# Patient Record
Sex: Female | Born: 1995 | Race: White | Hispanic: No | Marital: Single | Smoking: Current every day smoker
Health system: Southern US, Community
[De-identification: ages and names within clinical notes are randomized; demographics above are authoritative.]

## PROBLEM LIST (undated history)

## (undated) DIAGNOSIS — K219 Gastro-esophageal reflux disease without esophagitis: Secondary | ICD-10-CM

## (undated) DIAGNOSIS — N390 Urinary tract infection, site not specified: Secondary | ICD-10-CM

## (undated) HISTORY — PX: NO PAST SURGERIES: SHX2092

---
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
## 2014-09-13 ENCOUNTER — Inpatient Hospital Stay (HOSPITAL_COMMUNITY)
Admission: AD | Admit: 2014-09-13 | Discharge: 2014-09-13 | Disposition: A | Discharge status: Home / Self Care | Payer: BLUE CROSS/BLUE SHIELD | Source: Ambulatory Visit | Attending: Obstetrics & Gynecology | Admitting: Obstetrics & Gynecology

## 2014-09-13 ENCOUNTER — Encounter (HOSPITAL_COMMUNITY): Payer: Self-pay | Admitting: *Deleted

## 2014-09-13 DIAGNOSIS — F17210 Nicotine dependence, cigarettes, uncomplicated: Secondary | ICD-10-CM | POA: Diagnosis not present

## 2014-09-13 DIAGNOSIS — A499 Bacterial infection, unspecified: Secondary | ICD-10-CM

## 2014-09-13 DIAGNOSIS — N926 Irregular menstruation, unspecified: Secondary | ICD-10-CM | POA: Insufficient documentation

## 2014-09-13 DIAGNOSIS — N760 Acute vaginitis: Secondary | ICD-10-CM | POA: Diagnosis not present

## 2014-09-13 DIAGNOSIS — Z3202 Encounter for pregnancy test, result negative: Secondary | ICD-10-CM | POA: Diagnosis not present

## 2014-09-13 DIAGNOSIS — B9689 Other specified bacterial agents as the cause of diseases classified elsewhere: Secondary | ICD-10-CM | POA: Insufficient documentation

## 2014-09-13 DIAGNOSIS — K219 Gastro-esophageal reflux disease without esophagitis: Secondary | ICD-10-CM | POA: Diagnosis not present

## 2014-09-13 HISTORY — DX: Urinary tract infection, site not specified: N39.0

## 2014-09-13 HISTORY — DX: Gastro-esophageal reflux disease without esophagitis: K21.9

## 2014-09-13 LAB — URINALYSIS, ROUTINE W REFLEX MICROSCOPIC
Bilirubin Urine: NEGATIVE
Glucose, UA: NEGATIVE mg/dL
Hgb urine dipstick: NEGATIVE
KETONES UR: NEGATIVE mg/dL
LEUKOCYTES UA: NEGATIVE
NITRITE: NEGATIVE
Protein, ur: NEGATIVE mg/dL
SPECIFIC GRAVITY, URINE: 1.015 (ref 1.005–1.030)
UROBILINOGEN UA: 0.2 mg/dL (ref 0.0–1.0)
pH: 6.5 (ref 5.0–8.0)

## 2014-09-13 LAB — WET PREP, GENITAL
Trich, Wet Prep: NONE SEEN
YEAST WET PREP: NONE SEEN

## 2014-09-13 LAB — POCT PREGNANCY, URINE: PREG TEST UR: NEGATIVE

## 2014-09-13 MED ORDER — METRONIDAZOLE 500 MG PO TABS: 500.0000 mg | ORAL_TABLET | Freq: Two times a day (BID) | ORAL | Status: AC

## 2014-09-13 NOTE — Discharge Instructions (Signed)
Bacterial Vaginosis Bacterial vaginosis is a vaginal infection that occurs when the normal balance of bacteria in the vagina is disrupted. It results from an overgrowth of certain bacteria. This is the most common vaginal infection in women of childbearing age. Treatment is important to prevent complications, especially in pregnant women, as it can cause a premature delivery. CAUSES  Bacterial vaginosis is caused by an increase in harmful bacteria that are normally present in smaller amounts in the vagina. Several different kinds of bacteria can cause bacterial vaginosis. However, the reason that the condition develops is not fully understood. RISK FACTORS Certain activities or behaviors can put you at an increased risk of developing bacterial vaginosis, including:  Having a new sex partner or multiple sex partners.  Douching.  Using an intrauterine device (IUD) for contraception. Women do not get bacterial vaginosis from toilet seats, bedding, swimming pools, or contact with objects around them. SIGNS AND SYMPTOMS  Some women with bacterial vaginosis have no signs or symptoms. Common symptoms include:  Grey vaginal discharge.  A fishlike odor with discharge, especially after sexual intercourse.  Itching or burning of the vagina and vulva.  Burning or pain with urination. DIAGNOSIS  Your health care provider will take a medical history and examine the vagina for signs of bacterial vaginosis. A sample of vaginal fluid may be taken. Your health care provider will look at this sample under a microscope to check for bacteria and abnormal cells. A vaginal pH test may also be done.  TREATMENT  Bacterial vaginosis may be treated with antibiotic medicines. These may be given in the form of a pill or a vaginal cream. A second round of antibiotics may be prescribed if the condition comes back after treatment.  HOME CARE INSTRUCTIONS   Only take over-the-counter or prescription medicines as  directed by your health care provider.  If antibiotic medicine was prescribed, take it as directed. Make sure you finish it even if you start to feel better.  Do not have sex until treatment is completed.  Tell all sexual partners that you have a vaginal infection. They should see their health care provider and be treated if they have problems, such as a mild rash or itching.  Practice safe sex by using condoms and only having one sex partner. SEEK MEDICAL CARE IF:   Your symptoms are not improving after 3 days of treatment.  You have increased discharge or pain.  You have a fever. MAKE SURE YOU:   Understand these instructions.  Will watch your condition.  Will get help right away if you are not doing well or get worse. FOR MORE INFORMATION  Centers for Disease Control and Prevention, Division of STD Prevention: www.cdc.gov/std American Sexual Health Association (ASHA): www.ashastd.org  Document Released: 05/29/2005 Document Revised: 03/19/2013 Document Reviewed: 01/08/2013 Exit[employer] Patient Information 2015 [employer]. This information is not intended to replace advice given to you by your health care provider. Make sure you discuss any questions you have with your health care provider.  

## 2014-09-13 NOTE — MAU Note (Signed)
Pt c/o  nausea and vomiting for the past 3 weeks that increased yesterday and low abd cramping began yesterday as well.  7 weeks late for period , 4 negative UPT's. Reports pink spotting 3 weeks ago.

## 2014-09-13 NOTE — MAU Provider Note (Signed)
History     CSN: [id number]  Arrival date and time: 09/13/14 1633   First Provider Initiated Contact with Patient 09/13/14 1750      No chief complaint on file. irregular menses HPI Tiffany Combs 19 y.o. G0P0000 presents with absence of menses since 07/20/14 in the setting of typically regular menses.  She has noticed increased nausea, vomiting, chills, cramping and lower back pain.  She denies abdominal pain, dysuria, constipation, diarrhea, fever, weakness.   OB History    Gravida Para Term Preterm AB TAB SAB Ectopic Multiple Living        Past Medical History  Diagnosis Date  . Migraine   . UTI (lower urinary tract infection)     many in high school but non for 4-5 years  . GERD (gastroesophageal reflux disease)     Past Surgical History  Procedure Laterality Date  . No past surgeries      History reviewed. No pertinent family history.  History  Substance Use Topics  . Smoking status: Current Every Day Smoker    Types: Cigarettes  . Smokeless tobacco: Never Used  . Alcohol Use: No    Allergies: No Known Allergies  Prescriptions prior to admission  Medication Sig Dispense Refill Last Dose  . fluticasone (FLONASE) 50 MCG/ACT nasal spray Place 1 spray into both nostrils daily as needed for allergies or rhinitis.   Past Week at Unknown time  . loratadine (CLARITIN) 10 MG tablet Take 10 mg by mouth daily as needed for allergies.   Past Week at Unknown time  . ALPRAZolam (XANAX) 0.25 MG tablet Take 1-2 tablets (0.25-0.5 mg total) by mouth 3 (three) times daily as needed for anxiety. (Patient not taking: Reported on 09/13/2014) 30 tablet 0 10/03/2012 at Unknown  . silver sulfADIAZINE (SILVADENE) 1 % cream Apply topically daily. (Patient not taking: Reported on 09/13/2014) 50 g 0   . traMADol (ULTRAM) 50 MG tablet Take 1 tablet (50 mg total) by mouth every 6 (six) hours as needed for pain. (Patient not taking: Reported on 09/13/2014) 15 tablet 0     ROS  Pertinent ROS in HPI  Physical Exam   Last menstrual period 07/20/2014.  Physical Exam  Constitutional: She is oriented to person, place, and time. She appears well-developed and well-nourished. No distress.  HENT:  Head: Normocephalic and atraumatic.  Eyes: EOM are normal.  Neck: Normal range of motion.  Cardiovascular: Normal rate, regular rhythm and normal heart sounds.   Respiratory: Effort normal and breath sounds normal. No respiratory distress.  GI: Soft. Bowel sounds are normal. She exhibits no distension and no mass. There is no tenderness. There is no rebound and no guarding.  Genitourinary:  Small to mod amt of white homogenous, malodorous discharge.   Cervix is pink, smooth.  No CMT.  No adnexal mass or tenderness  Musculoskeletal: Normal range of motion.  Neurological: She is alert and oriented to person, place, and time.  Skin: Skin is warm and dry.  Psychiatric: She has a normal mood and affect.    Results for orders placed or performed during the hospital encounter of 09/13/14 (from the past 24 hour(s))  Urinalysis, Routine w reflex microscopic     Status: None   Collection Time: 09/13/14  4:45 PM  Result Value Ref Range   Color, Urine YELLOW YELLOW   APPearance CLEAR CLEAR   Specific Gravity, Urine 1.015 1.005 - 1.030   pH 6.5  5.0 - 8.0   Glucose, UA NEGATIVE NEGATIVE mg/dL   Hgb urine dipstick NEGATIVE NEGATIVE   Bilirubin Urine NEGATIVE NEGATIVE   Ketones, ur NEGATIVE NEGATIVE mg/dL   Protein, ur NEGATIVE NEGATIVE mg/dL   Urobilinogen, UA 0.2 0.0 - 1.0 mg/dL   Nitrite NEGATIVE NEGATIVE   Leukocytes, UA NEGATIVE NEGATIVE  Pregnancy, urine POC     Status: None   Collection Time: 09/13/14  5:01 PM  Result Value Ref Range   Preg Test, Ur NEGATIVE NEGATIVE  Wet prep, genital     Status: Abnormal   Collection Time: 09/13/14  6:10 PM  Result Value Ref Range   Yeast Wet Prep HPF POC NONE SEEN NONE SEEN   Trich, Wet Prep NONE SEEN NONE SEEN   Clue Cells Wet  Prep HPF POC FEW (A) NONE SEEN   WBC, Wet Prep HPF POC FEW (A) NONE SEEN     MAU Course  Procedures  MDM Clinical exam and wet prep suggestive of BV Negative Pregnancy test  Assessment and Plan  A:  1. Bacterial vaginosis   2. Negative pregnancy test    P: Discharge to home Flagyl bid x 1 week No etoh/IC x 10 days F/u with PCP PRN Patient may return to MAU as needed or if her condition were to change or worsen   Bertram Denvereague Clark, Karen E 09/13/2014, 5:51 PM

## 2014-09-14 LAB — GC/CHLAMYDIA PROBE AMP (~~LOC~~) NOT AT ARMC
Chlamydia: NEGATIVE
Neisseria Gonorrhea: NEGATIVE
